# Patient Record
Sex: Male | Born: 1980 | Race: White | Hispanic: No | Marital: Married | Smoking: Never smoker
Health system: Southern US, Community
[De-identification: ages and names within clinical notes are randomized; demographics above are authoritative.]

---

## 2006-05-03 ENCOUNTER — Other Ambulatory Visit: Payer: Self-pay

## 2006-05-03 ENCOUNTER — Emergency Department: Payer: Self-pay | Admitting: Emergency Medicine

## 2006-08-22 ENCOUNTER — Ambulatory Visit: Payer: Self-pay | Admitting: Psychiatry

## 2007-03-14 ENCOUNTER — Ambulatory Visit: Payer: Self-pay | Admitting: Gastroenterology

## 2010-11-08 ENCOUNTER — Emergency Department: Payer: Self-pay | Admitting: Emergency Medicine

## 2011-12-14 ENCOUNTER — Emergency Department: Payer: Self-pay

## 2012-02-17 ENCOUNTER — Ambulatory Visit: Payer: Self-pay | Admitting: Gastroenterology

## 2015-11-09 ENCOUNTER — Ambulatory Visit: Payer: Self-pay | Admitting: Physician Assistant

## 2015-11-09 ENCOUNTER — Encounter: Payer: Self-pay | Admitting: Physician Assistant

## 2015-11-09 VITALS — BP 121/90 | HR 116 | Temp 98.6°F

## 2015-11-09 DIAGNOSIS — M1000 Idiopathic gout, unspecified site: Secondary | ICD-10-CM

## 2015-11-09 MED ORDER — INDOMETHACIN 25 MG PO CAPS: 25.0000 mg | ORAL_CAPSULE | Freq: Three times a day (TID) | ORAL | Status: DC

## 2015-11-09 MED ORDER — COLCHICINE 0.6 MG PO TABS: ORAL_TABLET | ORAL | Status: DC

## 2015-11-09 NOTE — Progress Notes (Signed)
S: c/o gout flare, hx of same but never been diagnosed, took one of his friends colchicine without relief, also knees still hurting and would like to see another doctor, the doctor [facility] told him he had a bone spur that had broken off and he would just need to take nsaids when he hurts, pt states he hurts every day   O: vitals wnl, nad, left foot with red swollen area at great toe, tender a joint, full rom , n/v intact, knees with full rom, n/v intact  A: gout, chronic knee pain  P: indocin, colchicine, low purine diet, refer to sports med, labs cbc, uric acid

## 2015-11-09 NOTE — Patient Instructions (Signed)
Gout  Gout is when your joints become red, sore, and swell (inflamed). This is caused by the buildup of uric acid crystals in the joints. Uric acid is a chemical that is normally in the blood. If the level of uric acid gets too high in the blood, these crystals form in your joints and tissues. Over time, these crystals can form into masses near the joints and tissues. These masses can destroy bone and cause the bone to look misshapen (deformed).  HOME CARE    Do not take aspirin for pain.   Only take medicine as told by your doctor.   Rest the joint as much as you can. When in bed, keep sheets and blankets off painful areas.   Keep the sore joints raised (elevated).   Put warm or cold packs on painful joints. Use of warm or cold packs depends on which works best for you.   Use crutches if the painful joint is in your leg.   Drink enough fluids to keep your pee (urine) clear or pale yellow. Limit alcohol, sugary drinks, and drinks with fructose in them.   Follow your diet instructions. Pay careful attention to how much protein you eat. Include fruits, vegetables, whole grains, and fat-free or low-fat milk products in your daily diet. Talk to your doctor or dietitian about the use of coffee, vitamin C, and cherries. These may help lower uric acid levels.   Keep a healthy body weight.  GET HELP RIGHT AWAY IF:    You have watery poop (diarrhea), throw up (vomit), or have any side effects from medicines.   You do not feel better in 24 hours, or you are getting worse.   Your joint becomes suddenly more tender, and you have chills or a fever.  MAKE SURE YOU:    Understand these instructions.   Will watch your condition.   Will get help right away if you are not doing well or get worse.     This information is not intended to replace advice given to you by your health care provider. Make sure you discuss any questions you have with your health care provider.     Document Released: 09/20/2008 Document Revised:  01/02/2015 Document Reviewed: 07/25/2012  Elsevier Interactive Patient Education 2016 Elsevier Inc.  Low-Purine Diet  Purines are compounds that affect the level of uric acid in your body. A low-purine diet is a diet that is low in purines. Eating a low-purine diet can prevent the level of uric acid in your body from getting too high and causing gout or kidney stones or both.  WHAT DO I NEED TO KNOW ABOUT THIS DIET?   Choose low-purine foods. Examples of low-purine foods are listed in the next section.   Drink plenty of fluids, especially water. Fluids can help remove uric acid from your body. Try to drink 8-16 cups (1.9-3.8 L) a day.   Limit foods high in fat, especially saturated fat, as fat makes it harder for the body to get rid of uric acid. Foods high in saturated fat include pizza, cheese, ice cream, whole milk, fried foods, and gravies. Choose foods that are lower in fat and lean sources of protein. Use olive oil when cooking as it contains healthy fats that are not high in saturated fat.   Limit alcohol. Alcohol interferes with the elimination of uric acid from your body. If you are having a gout attack, avoid all alcohol.   Keep in mind that different people's bodies   react differently to different foods. You will probably learn over time which foods do or do not affect you. If you discover that a food tends to cause your gout to flare up, avoid eating that food. You can more freely enjoy foods that do not cause problems. If you have any questions about a food item, talk to your dietitian or health care provider.  WHICH FOODS ARE LOW, MODERATE, AND HIGH IN PURINES?  The following is a list of foods that are low, moderate, and high in purines. You can eat any amount of the foods that are low in purines. You may be able to have small amounts of foods that are moderate in purines. Ask your health care provider how much of a food moderate in purines you can have. Avoid foods high in  purines.  Grains   Foods low in purines: Enriched white bread, pasta, rice, cake, cornbread, popcorn.   Foods moderate in purines: Whole-grain breads and cereals, wheat germ, bran, oatmeal. Uncooked oatmeal. Dry wheat bran or wheat germ.   Foods high in purines: Pancakes, French toast, biscuits, muffins.  Vegetables   Foods low in purines: All vegetables, except those that are moderate in purines.   Foods moderate in purines: Asparagus, cauliflower, spinach, mushrooms, green peas.  Fruits   All fruits are low in purines.  Meats and other Protein Foods   Foods low in purines: Eggs, nuts, peanut butter.   Foods moderate in purines: 80-90% lean beef, lamb, veal, pork, poultry, fish, eggs, peanut butter, nuts. Crab, lobster, oysters, and shrimp. Cooked dried beans, peas, and lentils.   Foods high in purines: Anchovies, sardines, herring, mussels, tuna, codfish, scallops, trout, and haddock. Bacon. Organ meats (such as liver or kidney). Tripe. Game meat. Goose. Sweetbreads.  Dairy   All dairy foods are low in purines. Low-fat and fat-free dairy products are best because they are low in saturated fat.  Beverages   Drinks low in purines: Water, carbonated beverages, tea, coffee, cocoa.   Drinks moderate in purines: Soft drinks and other drinks sweetened with high-fructose corn syrup. Juices. To find whether a food or drink is sweetened with high-fructose corn syrup, look at the ingredients list.   Drinks high in purines: Alcoholic beverages (such as beer).  Condiments   Foods low in purines: Salt, herbs, olives, pickles, relishes, vinegar.   Foods moderate in purines: Butter, margarine, oils, mayonnaise.  Fats and Oils   Foods low in purines: All types, except gravies and sauces made with meat.   Foods high in purines: Gravies and sauces made with meat.  Other Foods   Foods low in purines: Sugars, sweets, gelatin. Cake. Soups made without meat.   Foods moderate in purines: Meat-based or fish-based soups,  broths, or bouillons. Foods and drinks sweetened with high-fructose corn syrup.   Foods high in purines: High-fat desserts (such as ice cream, cookies, cakes, pies, doughnuts, and chocolate).  Contact your dietitian for more information on foods that are not listed here.     This information is not intended to replace advice given to you by your health care provider. Make sure you discuss any questions you have with your health care provider.     Document Released: 04/08/2011 Document Revised: 12/17/2013 Document Reviewed: 11/18/2013  Elsevier Interactive Patient Education 2016 Elsevier Inc.

## 2015-11-10 LAB — CBC WITH DIFFERENTIAL/PLATELET
BASOS: 1 %
Basophils Absolute: 0.1 10*3/uL (ref 0.0–0.2)
EOS (ABSOLUTE): 0.1 10*3/uL (ref 0.0–0.4)
EOS: 2 %
HEMATOCRIT: 47.9 % (ref 37.5–51.0)
Hemoglobin: 16.8 g/dL (ref 12.6–17.7)
IMMATURE GRANS (ABS): 0.1 10*3/uL (ref 0.0–0.1)
IMMATURE GRANULOCYTES: 1 %
LYMPHS: 35 %
Lymphocytes Absolute: 2.6 10*3/uL (ref 0.7–3.1)
MCH: 31.5 pg (ref 26.6–33.0)
MCHC: 35.1 g/dL (ref 31.5–35.7)
MCV: 90 fL (ref 79–97)
MONOS ABS: 0.7 10*3/uL (ref 0.1–0.9)
Monocytes: 10 %
NEUTROS PCT: 51 %
Neutrophils Absolute: 3.9 10*3/uL (ref 1.4–7.0)
PLATELETS: 354 10*3/uL (ref 150–379)
RBC: 5.33 x10E6/uL (ref 4.14–5.80)
RDW: 13.8 % (ref 12.3–15.4)
WBC: 7.5 10*3/uL (ref 3.4–10.8)

## 2015-11-10 LAB — URIC ACID: Uric Acid: 8.2 mg/dL (ref 3.7–8.6)

## 2015-11-26 ENCOUNTER — Ambulatory Visit (INDEPENDENT_AMBULATORY_CARE_PROVIDER_SITE_OTHER): Payer: PRIVATE HEALTH INSURANCE | Admitting: Family Medicine

## 2015-11-26 ENCOUNTER — Other Ambulatory Visit (INDEPENDENT_AMBULATORY_CARE_PROVIDER_SITE_OTHER): Payer: PRIVATE HEALTH INSURANCE

## 2015-11-26 ENCOUNTER — Encounter: Payer: Self-pay | Admitting: Family Medicine

## 2015-11-26 VITALS — BP 140/92 | HR 97 | Wt 257.0 lb

## 2015-11-26 DIAGNOSIS — M109 Gout, unspecified: Secondary | ICD-10-CM | POA: Insufficient documentation

## 2015-11-26 DIAGNOSIS — M25561 Pain in right knee: Secondary | ICD-10-CM

## 2015-11-26 DIAGNOSIS — E663 Overweight: Secondary | ICD-10-CM | POA: Diagnosis not present

## 2015-11-26 DIAGNOSIS — M25562 Pain in left knee: Secondary | ICD-10-CM | POA: Diagnosis not present

## 2015-11-26 DIAGNOSIS — M10069 Idiopathic gout, unspecified knee: Secondary | ICD-10-CM

## 2015-11-26 MED ORDER — ALLOPURINOL 100 MG PO TABS: 100.0000 mg | ORAL_TABLET | Freq: Every day | ORAL | Status: DC

## 2015-11-26 MED ORDER — DICLOFENAC SODIUM 2 % TD SOLN: 2.0000 "application " | Freq: Two times a day (BID) | TRANSDERMAL | Status: DC

## 2015-11-26 NOTE — Progress Notes (Signed)
Tawana ScaleZach Mcguire D.O. [facility] [address] [place], [place] [address] Phone: [phone] Subjective:    I'm seeing this patient by the request  of:  No primary care provider on file. Greig RightSusan Fisher  CC: Bilateral knee pain  OZH:[id number]:Subjective Clent RidgesJesse E Mcguire is a 34 y.o. male coming in with complaint of bilateral knee pain. Patient is had this pain intermittently for multiple years. Patient does not remember any specific injury. Does do a lot of manual labor. States that certain activities such as going up and downstairs or a ladder can cause increased pain. Has seen another provider for previously and had an x-ray. He was told that he had a bone spur. States that he was just told to wear brace and that he should get better. Patient also has a past medical history significant for gout. Patient rates the severity of pain a 6 out of 10. Does respond to anti-inflammatories. Does not have any instability. Denies any nighttime awakening.    No past medical history on file.  Gout, seasonal allergies.  No past surgical history on file. none stated.  Social History  Substance Use Topics  . Smoking status: Unknown If Ever Smoked  . Smokeless tobacco: None  . Alcohol Use: None   No Known Allergies No family history on file.   Past medical history, social, surgical and family history all reviewed in electronic medical record.   Review of Systems: No headache, visual changes, nausea, vomiting, diarrhea, constipation, dizziness, abdominal pain, skin rash, fevers, chills, night sweats, weight loss, swollen lymph nodes, body aches, joint swelling, muscle aches, chest pain, shortness of breath, mood changes.   Objective Blood pressure 140/92, pulse 97, weight 257 lb (116.574 kg), SpO2 97 %.  General: No apparent distress alert and oriented x3 mood and affect normal, dressed appropriately.  HEENT: Pupils equal, extraocular movements intact  Respiratory: Patient's speak in full sentences and does  not appear short of breath  Cardiovascular: No lower extremity edema, non tender, no erythema  Skin: Warm dry intact with no signs of infection or rash on extremities or on axial skeleton.  Abdomen: Soft nontender  Neuro: Cranial nerves II through XII are intact, neurovascularly intact in all extremities with 2+ DTRs and 2+ pulses.  Lymph: No lymphadenopathy of posterior or anterior cervical chain or axillae bilaterally.  Gait normal with good balance and coordination.  MSK:  Non tender with full range of motion and good stability and symmetric strength and tone of shoulders, elbows, wrist, hip, and ankles bilaterally.  Knee: Bilateral Normal to inspection with no erythema or effusion or obvious bony abnormalities. Palpation normal with no warmth, joint line tenderness, patellar tenderness, or condyle tenderness. ROM full in flexion and extension and lower leg rotation. Ligaments with solid consistent endpoints including ACL, PCL, LCL, MCL. Negative Mcmurray's, Apley's, and Thessalonian tests. Non painful patellar compression. Patellar glide without crepitus. Patellar and quadriceps tendons unremarkable. Hamstring and quadriceps strength is normal.  Fairly unremarkable exam  MSK US performed of: Bilateral  This study was ordered, performed, and interpreted by Terrilee FilesZach Mcguire D.O.  Knee: All structures visualized. Minimal narrowing of the medial joint space narrowing patient's right knee does have double line that can be consistent with gouty deposits. Patellar Tendon unremarkable on long and transverse views without effusion. No abnormality of prepatellar bursa. LCL and MCL unremarkable on long and transverse views. No abnormality of origin of medial or lateral head of the gastrocnemius. 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

## 2015-11-26 NOTE — Assessment & Plan Note (Signed)
Encourage weight loss. 

## 2015-11-26 NOTE — Patient Instructions (Addendum)
Good to see you.  Ice 20 minutes 2 times daily. Usually after activity and before bed. Wear brace or get a compression sleeve (tommy copper) pennsaid pinkie amount topically 2 times daily as needed.  allopurinol daily to treat gout but continue the indomethacin at the same time.  Vitamin D 2000 IU daily Turmeric 500mg  twice daily See me again in 3-6 weeks.

## 2015-11-26 NOTE — Progress Notes (Signed)
Pre visit review using our clinic review tool, if applicable. No additional management support is needed unless otherwise documented below in the visit note. 

## 2015-11-26 NOTE — Assessment & Plan Note (Signed)
I do believe the patient's knee pain is multifactorial. 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 I do not see any type of internal derangement on exam or on ultrasound today. Do not feel further workup such as x-rays as necessary at this time. Did not notice any instabilities and no bracing on a regular basis is necessary. I would like to start him on a low dose of allopurinol and warned of potential side effects. We discussed icing regimen, topical anti-inflammatories and prescription was sent in. Patient will remain active. Patient come back again in 3-6 weeks to make sure that he is responding to management.

## 2016-01-06 ENCOUNTER — Ambulatory Visit: Payer: Self-pay | Admitting: Physician Assistant

## 2016-01-06 ENCOUNTER — Encounter: Payer: Self-pay | Admitting: Physician Assistant

## 2016-01-06 VITALS — BP 140/100 | HR 91 | Temp 98.4°F

## 2016-01-06 DIAGNOSIS — J069 Acute upper respiratory infection, unspecified: Secondary | ICD-10-CM

## 2016-01-06 MED ORDER — AMOXICILLIN-POT CLAVULANATE 875-125 MG PO TABS: 1.0000 | ORAL_TABLET | Freq: Two times a day (BID) | ORAL | Status: DC

## 2016-01-06 NOTE — Progress Notes (Signed)
S: C/o runny nose and congestion for 3 days, no fever, chills, cp/sob, v/d; mucus is green and thick, cough is sporadic, cough is controlled with dayquil and nyquil,   O: PE: vitals wnl except bp elevated, nad,  perrl eomi, normocephalic, tms dull, nasal mucosa red and swollen, throat injected, neck supple no lymph, lungs c t a, cv rrr, neuro intact  A:  Acute sinusitis, elevated bp secondary to cold meds   P: augmentin 875mg  bid, stop cold meds with decongestant; drink fluids, continue regular meds , return if not improving in 5 days, return earlier if worsening

## 2016-01-07 ENCOUNTER — Encounter: Payer: Self-pay | Admitting: Family Medicine

## 2016-01-07 ENCOUNTER — Ambulatory Visit (INDEPENDENT_AMBULATORY_CARE_PROVIDER_SITE_OTHER): Payer: Managed Care, Other (non HMO) | Admitting: Family Medicine

## 2016-01-07 ENCOUNTER — Ambulatory Visit (INDEPENDENT_AMBULATORY_CARE_PROVIDER_SITE_OTHER)
Admission: RE | Admit: 2016-01-07 | Discharge: 2016-01-07 | Disposition: A | Discharge status: Home / Self Care | Payer: Managed Care, Other (non HMO) | Source: Ambulatory Visit | Attending: Family Medicine | Admitting: Family Medicine

## 2016-01-07 VITALS — BP 132/88 | HR 86 | Wt 258.0 lb

## 2016-01-07 DIAGNOSIS — M10069 Idiopathic gout, unspecified knee: Secondary | ICD-10-CM

## 2016-01-07 DIAGNOSIS — M25562 Pain in left knee: Secondary | ICD-10-CM

## 2016-01-07 DIAGNOSIS — M25561 Pain in right knee: Secondary | ICD-10-CM | POA: Diagnosis not present

## 2016-01-07 MED ORDER — ALLOPURINOL 100 MG PO TABS
200.0000 mg | ORAL_TABLET | Freq: Every day | ORAL | Status: DC
Start: 2016-01-07 — End: 2016-02-04

## 2016-01-07 NOTE — Progress Notes (Signed)
[facility] [address] [place], [place] [address] Phone: [phone] Subjective:     CC: Bilateral knee pain f/u   [id number] Vincent Mcguire is a 35 y.o. male coming in with complaint of bilateral knee pain. Patient appeared to likely have some gouty deposits in the knees. Patient was started on colchicine as well as allopurinol. Patient given topical anti-inflammatories for pain relief. Patient was to monitor for any type of worsening symptoms. Patient states very minimal improvement and even some days when it seems worsening in pain. States that he continues to work. In any type of pressure on the knees or any flexion of the knees continue to give him some discomfort. Patient states that it is not stopping him from activities but has made to have to augment how he positions himself's. Patient continues to be very active. No side effects to the allopurinol. States that when he took colchicine very minimal improvement. States that the topical anti-inflammatory though has been the most helpful if anything.    No past medical history on file.  Gout, seasonal allergies.  No past surgical history on file. none stated.  Social History  Substance Use Topics  . Smoking status: Never Smoker   . Smokeless tobacco: Current User  . Alcohol Use: None   No Known Allergies No family history on file. positive family history of gout as well as a positive family history of autoimmune disease. Patient does not know which one.   Past medical history, social, surgical and family history all reviewed in electronic medical record.   Review of Systems: No headache, visual changes, nausea, vomiting, diarrhea, constipation, dizziness, abdominal pain, skin rash, fevers, chills, night sweats, weight loss, swollen lymph nodes, body aches, joint swelling, muscle aches, chest pain, shortness of breath, mood changes.   Objective Blood pressure 132/88, pulse 86, weight 258 lb (117.028  kg), SpO2 96 %.  General: No apparent distress alert and oriented x3 mood and affect normal, dressed appropriately.  HEENT: Pupils equal, extraocular movements intact  Respiratory: Patient's speak in full sentences and does not appear short of breath  Cardiovascular: No lower extremity edema, non tender, no erythema  Skin: Warm dry intact with no signs of infection or rash on extremities or on axial skeleton.  Abdomen: Soft nontender  Neuro: Cranial nerves II through XII are intact, neurovascularly intact in all extremities with 2+ DTRs and 2+ pulses.  Lymph: No lymphadenopathy of posterior or anterior cervical chain or axillae bilaterally.  Gait normal with good balance and coordination.  MSK:  Non tender with full range of motion and good stability and symmetric strength and tone of shoulders, elbows, wrist, hip, and ankles bilaterally.  Knee: Bilateral Normal to inspection with no erythema or effusion or obvious bony abnormalities. Palpation normal with no warmth, joint line tenderness, patellar tenderness, or condyle tenderness. ROM full in flexion and extension and lower leg rotation. Ligaments with solid consistent endpoints including ACL, PCL, LCL, MCL. Negative Mcmurray's, Apley's, and Thessalonian tests. Non painful patellar compression. Patellar glide with minimal crepitus. Patellar and quadriceps tendons unremarkable. Hamstring and quadriceps strength is normal.  Fairly unremarkable exam  After informed written and verbal consent, patient was seated on exam table. Right knee was prepped with alcohol swab and utilizing anterolateral approach, patient's right knee space was injected with 4:1  marcaine 0.5%: Kenalog 40mg /dL. Patient tolerated the procedure well without immediate complications.  After informed written and verbal consent, patient was seated on exam table.  Left knee was prepped with alcohol swab and utilizing anterolateral approach, patient's left knee space was  injected with 4:1  marcaine 0.5%: Kenalog 40mg /dL. Patient tolerated the procedure well without immediate complications.    Impression and Recommendations:     This case required medical decision making of moderate complexity.

## 2016-01-07 NOTE — Progress Notes (Signed)
Pre visit review using our clinic review tool, if applicable. No additional management support is needed unless otherwise documented below in the visit note. 

## 2016-01-07 NOTE — Assessment & Plan Note (Signed)
Increased to 200 mg daily

## 2016-01-07 NOTE — Assessment & Plan Note (Signed)
I do believe the patient's bilateral knee pain is more secondary to the gout. X-rays ordered today for further evaluation for any other bony normality. No signs or symptoms that would be corresponding with a meniscal injury. Patient does have some increasing wear and tear secondary to the work that he does. Patient remains active and has noticed some improvement with the brace. We discussed the possibility of formal physical therapy which patient declined. Patient is to try the injections as well as increasing the allopurinol Avenue perception given. Patient will come back in 3 weeks to make sure he is responding.

## 2016-01-07 NOTE — Patient Instructions (Signed)
Good to see you Ice is your friend Xray downstairs on your way out and no news is good news Stay active  Wear brace when needed Lets increase the allopurinol to 200mg  daily to see if it helps.  Stop if you get a rash See me again in 4 weeks!

## 2016-02-04 ENCOUNTER — Ambulatory Visit (INDEPENDENT_AMBULATORY_CARE_PROVIDER_SITE_OTHER): Payer: Managed Care, Other (non HMO) | Admitting: Family Medicine

## 2016-02-04 ENCOUNTER — Encounter: Payer: Self-pay | Admitting: Family Medicine

## 2016-02-04 VITALS — BP 140/108 | HR 90 | Wt 255.0 lb

## 2016-02-04 DIAGNOSIS — M25561 Pain in right knee: Secondary | ICD-10-CM | POA: Diagnosis not present

## 2016-02-04 DIAGNOSIS — E663 Overweight: Secondary | ICD-10-CM

## 2016-02-04 DIAGNOSIS — M25562 Pain in left knee: Secondary | ICD-10-CM | POA: Diagnosis not present

## 2016-02-04 DIAGNOSIS — M10069 Idiopathic gout, unspecified knee: Secondary | ICD-10-CM

## 2016-02-04 MED ORDER — ALLOPURINOL 300 MG PO TABS: 300.0000 mg | ORAL_TABLET | Freq: Every day | ORAL | Status: DC

## 2016-02-04 NOTE — Assessment & Plan Note (Signed)
Discussed with patient at great length. I do not see any bony normality. Patient to continue the topical anti-inflammatories. I do think that this is likely secondary to gout and patient being overweight. Patient will have his allopurinol increased to 300 mg. We also discussed continuing over-the-counter medicines and given some other choices. Patient and will come back and see me again in 6 weeks. If patient has any instability of the knee advance imaging would be warranted.

## 2016-02-04 NOTE — Assessment & Plan Note (Signed)
Encourage weight loss. Discussed the possibility of formal physical therapy which patient declined.

## 2016-02-04 NOTE — Patient Instructions (Signed)
Good to see you  Allopurinol  at night Tart cherry extract pill any dose at night Try a bike or walk on a treadmill We can always consider physical therapy  If worsening symptoms we may need to consider MRI but would likely mean surgery  See me again in 6-8 weeks.

## 2016-02-04 NOTE — Progress Notes (Signed)
Pre visit review using our clinic review tool, if applicable. No additional management support is needed unless otherwise documented below in the visit note. 

## 2016-02-04 NOTE — Assessment & Plan Note (Signed)
Patient increased to 300 mg allopurinol

## 2016-02-04 NOTE — Progress Notes (Signed)
  [facility] [address] [place], [place] [address] Phone: [phone] Subjective:     CC: Bilateral knee pain f/u   Vincent Mcguire is a 35 y.o. male coming in with complaint of bilateral knee pain. Patient appeared to likely have some gouty deposits in the knees. Patient elected try injections after last visit. An states that they were somewhat helpful. Feels that the allopurinol going up to 200 mg was even more helpful. Comfort on at all times. Patient states that when he puts pressure on the knees is still seems to be very uncomfortable. Been wearing the brace fairly regularly. Denies any new symptoms such as radiation or swelling.    No past medical history on file.  Gout, seasonal allergies.  No past surgical history on file. none stated.  Social History  Substance Use Topics  . Smoking status: Never Smoker   . Smokeless tobacco: Current User  . Alcohol Use: Not on file   No Known Allergies No family history on file. positive family history of gout as well as a positive family history of autoimmune disease. Patient does not know which one.   Past medical history, social, surgical and family history all reviewed in electronic medical record.   Review of Systems: No headache, visual changes, nausea, vomiting, diarrhea, constipation, dizziness, abdominal pain, skin rash, fevers, chills, night sweats, weight loss, swollen lymph nodes, body aches, joint swelling, muscle aches, chest pain, shortness of breath, mood changes.   Objective Blood pressure 140/108, pulse 90, weight 255 lb (115.667 kg), SpO2 95 %.  General: No apparent distress alert and oriented x3 mood and affect normal, dressed appropriately.  HEENT: Pupils equal, extraocular movements intact  Respiratory: Patient's speak in full sentences and does not appear short of breath  Cardiovascular: No lower extremity edema, non tender, no erythema  Skin: Warm dry intact with no signs  of infection or rash on extremities or on axial skeleton.  Abdomen: Soft nontender  Neuro: Cranial nerves II through XII are intact, neurovascularly intact in all extremities with 2+ DTRs and 2+ pulses.  Lymph: No lymphadenopathy of posterior or anterior cervical chain or axillae bilaterally.  Gait normal with good balance and coordination.  MSK:  Non tender with full range of motion and good stability and symmetric strength and tone of shoulders, elbows, wrist, hip, and ankles bilaterally.  Knee: Bilateral Normal to inspection with no erythema or effusion or obvious bony abnormalities. Minimal tenderness over the medial joint lines ROM full in flexion and extension and lower leg rotation. Ligaments with solid consistent endpoints including ACL, PCL, LCL, MCL. Negative Mcmurray's, Apley's, and Thessalonian tests. Non painful patellar compression. Patellar glide with minimal crepitus. Patellar and quadriceps tendons unremarkable. Hamstring and quadriceps strength is normal.  unremarkable exam     Impression and Recommendations:     This case required medical decision making of moderate complexity.

## 2016-03-21 ENCOUNTER — Ambulatory Visit: Payer: Self-pay | Admitting: Physician Assistant

## 2016-03-21 ENCOUNTER — Encounter: Payer: Self-pay | Admitting: Physician Assistant

## 2016-03-21 VITALS — BP 110/70 | HR 118 | Temp 99.5°F

## 2016-03-21 DIAGNOSIS — J09X2 Influenza due to identified novel influenza A virus with other respiratory manifestations: Secondary | ICD-10-CM

## 2016-03-21 DIAGNOSIS — R509 Fever, unspecified: Secondary | ICD-10-CM

## 2016-03-21 LAB — POCT INFLUENZA A/B
INFLUENZA B, POC: NEGATIVE
Influenza A, POC: POSITIVE — AB

## 2016-03-21 MED ORDER — HYDROCOD POLST-CPM POLST ER 10-8 MG/5ML PO SUER
5.0000 mL | Freq: Two times a day (BID) | ORAL | Status: DC | PRN
Start: 2016-03-21 — End: 2016-05-12

## 2016-03-21 MED ORDER — OSELTAMIVIR PHOSPHATE 75 MG PO CAPS: 75.0000 mg | ORAL_CAPSULE | Freq: Two times a day (BID) | ORAL | Status: DC

## 2016-03-21 NOTE — Progress Notes (Signed)
S: C/o runny nose and congestion with dry cough for 2 days, + fever, chills, and body aches;  denies cp/sob, v/d;    Using otc meds: nyquil  O: PE: vitals w elevated temp,  nad,  perrl eomi, normocephalic, tms dull, nasal mucosa red and swollen, throat injected, neck supple no lymph, lungs c t a, cv rrr, neuro intact, flu swab + A  A:  Acute influenza A   P: tamiflu, tussionex; drink fluids, continue regular meds , use otc meds of choice, return if not improving in 5 days, return earlier if worsening

## 2016-03-31 ENCOUNTER — Ambulatory Visit (INDEPENDENT_AMBULATORY_CARE_PROVIDER_SITE_OTHER): Payer: Managed Care, Other (non HMO) | Admitting: Family Medicine

## 2016-03-31 ENCOUNTER — Encounter: Payer: Self-pay | Admitting: Family Medicine

## 2016-03-31 ENCOUNTER — Ambulatory Visit (INDEPENDENT_AMBULATORY_CARE_PROVIDER_SITE_OTHER)
Admission: RE | Admit: 2016-03-31 | Discharge: 2016-03-31 | Disposition: A | Discharge status: Home / Self Care | Payer: Managed Care, Other (non HMO) | Source: Ambulatory Visit | Attending: Family Medicine | Admitting: Family Medicine

## 2016-03-31 VITALS — BP 142/84 | HR 64 | Wt 258.0 lb

## 2016-03-31 DIAGNOSIS — M10069 Idiopathic gout, unspecified knee: Secondary | ICD-10-CM | POA: Diagnosis not present

## 2016-03-31 DIAGNOSIS — M25561 Pain in right knee: Secondary | ICD-10-CM

## 2016-03-31 DIAGNOSIS — M25562 Pain in left knee: Secondary | ICD-10-CM

## 2016-03-31 DIAGNOSIS — E663 Overweight: Secondary | ICD-10-CM | POA: Diagnosis not present

## 2016-03-31 MED ORDER — PREDNISONE 50 MG PO TABS: 50.0000 mg | ORAL_TABLET | Freq: Every day | ORAL | Status: DC

## 2016-03-31 NOTE — Assessment & Plan Note (Signed)
Dentist point still no significant findings other than the elevated uric acid that could be contribute into some of his pain. Patient does not feel that the allopurinol is helping and he can do a trial without it. We discussed continuing the topical anti-inflammatories. Patient given a 5 day dose of prednisone to see if this will be beneficial. We may need to consider further workup including autoimmune diseases to rule out. At this point though I do think most of it is musculoskeletal and patient will be referred to formal physical therapy. History of a back fracture and we'll get x-rays to further evaluate to make sure there is no radicular symptoms that are contribute in. Patient and will come back and see me again in 4-6 weeks for further evaluation and treatment.

## 2016-03-31 NOTE — Assessment & Plan Note (Signed)
Discussed with patient about a trial off the medication. If worsening symptoms to restart.

## 2016-03-31 NOTE — Assessment & Plan Note (Signed)
Encourage weight loss. 

## 2016-03-31 NOTE — Patient Instructions (Signed)
Good to see you  Stop the allopurinol  Would continue the vitamins Xray downstairs today of your back  Physical therapy will be calling you  Ice is still good.  Prednisone daily for 5 days.  See me again in 6 weeks to make sure you have made improvement or we will need to consider MRI of the knees or back if your xray is bad.

## 2016-03-31 NOTE — Progress Notes (Signed)
  Tawana ScaleZach Smith D.O. [facility] [address] [place], [place] [address] Phone: [phone] Subjective:     CC: Bilateral knee pain f/u   BJY:NWGNFAOZHYHPI:Subjective Clent RidgesJesse E Mcguire is a 35 y.o. male coming in with complaint of bilateral knee pain. Patient appeared to likely have some gouty deposits in the knees. Patient elected try injections LMA mild improvement. We didn't increase his allopurinol 300 mg. Continues to have difficulty as well. Does not think that the medication is improving. Patient states sometimes it feels like he has weakness in the knees bilaterally when he is coming down stairs at the end of a long day. Still more pain when he puts pressure on the knees themselves.has had workup including x-rays previously that showed no bony abnormality that were independently visualized by me.patient does give a new history of a back fracture greater than 6 she should go from a fall of 12 feet. States sometimes he has some mild back pain with it.    No past medical history on file.  Gout, seasonal allergies.  No past surgical history on file. none stated.  Social History  Substance Use Topics  . Smoking status: Never Smoker   . Smokeless tobacco: Current User  . Alcohol Use: None   No Known Allergies No family history on file. positive family history of gout as well as a positive family history of autoimmune disease. Patient does not know which one.   Past medical history, social, surgical and family history all reviewed in electronic medical record.   Review of Systems: No headache, visual changes, nausea, vomiting, diarrhea, constipation, dizziness, abdominal pain, skin rash, fevers, chills, night sweats, weight loss, swollen lymph nodes, body aches, joint swelling, muscle aches, chest pain, shortness of breath, mood changes.   Objective Blood pressure 142/84, pulse 64, weight 258 lb (117.028 kg).  General: No apparent distress alert and oriented x3 mood and affect normal, dressed  appropriately.  HEENT: Pupils equal, extraocular movements intact  Respiratory: Patient's speak in full sentences and does not appear short of breath  Cardiovascular: No lower extremity edema, non tender, no erythema  Skin: Warm dry intact with no signs of infection or rash on extremities or on axial skeleton.  Abdomen: Soft nontender  Neuro: Cranial nerves II through XII are intact, neurovascularly intact in all extremities with 2+ DTRs and 2+ pulses.  Lymph: No lymphadenopathy of posterior or anterior cervical chain or axillae bilaterally.  Gait normal with good balance and coordination.  MSK:  Non tender with full range of motion and good stability and symmetric strength and tone of shoulders, elbows, wrist, hip, and ankles bilaterally.  Knee: Bilateral Normal to inspection with no erythema or effusion or obvious bony abnormalities. Continued mild tenderness to palpation mostly over the medial joint lines. ROM full in flexion and extension and lower leg rotation. Ligaments with solid consistent endpoints including ACL, PCL, LCL, MCL. Negative Mcmurray's, Apley's, and Thessalonian tests. Non painful patellar compression. Patellar glide with minimal crepitus would not consider any significant change.. Patellar and quadriceps tendons unremarkable. Hamstring and quadriceps strength is normal.  unremarkable exam     Impression and Recommendations:     This case required medical decision making of moderate complexity.

## 2016-05-12 ENCOUNTER — Ambulatory Visit (INDEPENDENT_AMBULATORY_CARE_PROVIDER_SITE_OTHER): Payer: Managed Care, Other (non HMO) | Admitting: Family Medicine

## 2016-05-12 ENCOUNTER — Encounter: Payer: Self-pay | Admitting: Family Medicine

## 2016-05-12 VITALS — BP 130/84 | HR 86 | Wt 251.0 lb

## 2016-05-12 DIAGNOSIS — M10069 Idiopathic gout, unspecified knee: Secondary | ICD-10-CM | POA: Diagnosis not present

## 2016-05-12 DIAGNOSIS — M25562 Pain in left knee: Secondary | ICD-10-CM

## 2016-05-12 DIAGNOSIS — E663 Overweight: Secondary | ICD-10-CM

## 2016-05-12 DIAGNOSIS — M25561 Pain in right knee: Secondary | ICD-10-CM | POA: Diagnosis not present

## 2016-05-12 MED ORDER — FEBUXOSTAT 40 MG PO TABS: 40.0000 mg | ORAL_TABLET | Freq: Every day | ORAL | Status: DC

## 2016-05-12 NOTE — Assessment & Plan Note (Signed)
Secondary to gout. Discussed diet changes again, we discussed avoiding direct impact in the area. Hopefully will start to improve. Would like to avoid more injection secondary to patient's age. If necessary patient will come back again in 6-12 weeks

## 2016-05-12 NOTE — Patient Instructions (Signed)
Good to see you  Ice 20 minutes 2 times daily. Usually after activity and before bed. Try the uloric daily  Tart cherry extract at night any dose. Try [employer] Finish PT this week.  Send me a message in my chart in 6 weeks and tell me how you are doing.

## 2016-05-12 NOTE — Assessment & Plan Note (Signed)
Encourage weight loss. 

## 2016-05-12 NOTE — Assessment & Plan Note (Signed)
Do believe that this is patient's underlying condition. We did discuss uloric and given a prescription coupon today. I'm hoping that this will be beneficial. We discussed the possibility of prednisone again but patient declined that at the moment. We discussed icing regimen. We did discuss possibly using colchicine on a regular basis which patient stated that he felt was going to be too expensive. Encourage him to attempt weight loss. Continue the other exercises. Follow-up with me again in 6-12 weeks. If no significant improvement possible referral to rheumatology.

## 2016-05-12 NOTE — Progress Notes (Signed)
Pre visit review using our clinic review tool, if applicable. No additional management support is needed unless otherwise documented below in the visit note. 

## 2016-05-12 NOTE — Progress Notes (Signed)
  Tawana ScaleZach Kashawn Dirr D.O. [facility] [address] [place], [place] [address] Phone: [phone] Subjective:     CC: Bilateral knee pain f/u   OZH:YQMVHQIONGHPI:Subjective Vincent RidgesJesse E Mcguire is a 35 y.o. male coming in with complaint of bilateral knee pain. Patient appeared to likely have some gouty deposits in the knees. Patient denies any be responding to the allopurinol. Patient discontinue this. Seems that his knee pain as well as back pain seems to be getting somewhat worse. Patient went to formal physical therapy for both his knees and back and states that he has not notice improvement as well. States that he just has more soreness. Continues to try to stay active. Still same problems where patient was having previously.    No past medical history on file.  Gout, seasonal allergies.  No past surgical history on file. none stated.  Social History  Substance Use Topics  . Smoking status: Never Smoker   . Smokeless tobacco: Current User  . Alcohol Use: None   No Known Allergies No family history on file. positive family history of gout as well as a positive family history of autoimmune disease. Patient does not know which one.   Past medical history, social, surgical and family history all reviewed in electronic medical record.   Review of Systems: No headache, visual changes, nausea, vomiting, diarrhea, constipation, dizziness, abdominal pain, skin rash, fevers, chills, night sweats, weight loss, swollen lymph nodes, body aches, joint swelling, muscle aches, chest pain, shortness of breath, mood changes.   Objective Blood pressure 130/84, pulse 86, weight 251 lb (113.853 kg), SpO2 96 %.  General: No apparent distress alert and oriented x3 mood and affect normal, dressed appropriately.  HEENT: Pupils equal, extraocular movements intact  Respiratory: Patient's speak in full sentences and does not appear short of breath  Cardiovascular: No lower extremity edema, non tender, no erythema  Skin: Warm  dry intact with no signs of infection or rash on extremities or on axial skeleton.  Abdomen: Soft nontender  Neuro: Cranial nerves II through XII are intact, neurovascularly intact in all extremities with 2+ DTRs and 2+ pulses.  Lymph: No lymphadenopathy of posterior or anterior cervical chain or axillae bilaterally.  Gait normal with good balance and coordination.  MSK:  Non tender with full range of motion and good stability and symmetric strength and tone of shoulders, elbows, wrist, hip, and ankles bilaterally.  Knee: Bilateral Normal to inspection with no erythema or effusion or obvious bony abnormalities. Continued mild tenderness to palpation mostly over the medial joint lines. ROM full in flexion and extension and lower leg rotation. Ligaments with solid consistent endpoints including ACL, PCL, LCL, MCL. Negative Mcmurray's, Apley's, and Thessalonian tests. Non painful patellar compression. Patellar glide with minimal crepitus  Patellar and quadriceps tendons unremarkable. Hamstring and quadriceps strength is normal.  No significant change from previous exam    Impression and Recommendations:     This case required medical decision making of moderate complexity.

## 2016-07-19 ENCOUNTER — Ambulatory Visit: Payer: Self-pay | Admitting: Physician Assistant

## 2016-07-19 ENCOUNTER — Encounter: Payer: Self-pay | Admitting: Physician Assistant

## 2016-07-19 VITALS — BP 120/80 | HR 97 | Temp 98.4°F

## 2016-07-19 DIAGNOSIS — R0789 Other chest pain: Secondary | ICD-10-CM

## 2016-07-19 NOTE — Progress Notes (Signed)
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

## 2016-10-25 ENCOUNTER — Ambulatory Visit: Payer: Self-pay | Admitting: Physician Assistant

## 2016-10-25 ENCOUNTER — Encounter: Payer: Self-pay | Admitting: Physician Assistant

## 2016-10-25 VITALS — BP 120/79 | HR 92 | Temp 98.3°F

## 2016-10-25 DIAGNOSIS — R0789 Other chest pain: Secondary | ICD-10-CM

## 2016-10-25 NOTE — Progress Notes (Signed)
S: c/o cp and states his bp has been elevated for awhile, goes by EMS to have it checked and its been 160/100, not sure what size cuff they are using, states his chest will often hurt when he is in a crowd of people, states chest has been hurting for 2 days, no radiation of pain, no sob, no diaphoresis, fam hx + htn, dm, chf, grandfather died at 5686 of chf  O: vitals wnl, nad, lungs c t a, cv rrr, pt appears well  A: chest pain  P: ekg normal, will do fasting labs tomorrow, consider anxiety , refer to cardiology for eval if sx continue

## 2016-10-26 ENCOUNTER — Other Ambulatory Visit: Payer: Self-pay

## 2016-10-26 ENCOUNTER — Encounter (INDEPENDENT_AMBULATORY_CARE_PROVIDER_SITE_OTHER): Payer: Self-pay

## 2016-10-26 VITALS — BP 110/80

## 2016-10-26 DIAGNOSIS — Z299 Encounter for prophylactic measures, unspecified: Secondary | ICD-10-CM

## 2016-10-26 NOTE — Progress Notes (Signed)
Patient came in to have blood drawn for testing per Susan's authorization. 

## 2016-10-27 LAB — CMP12+LP+TP+TSH+6AC+CBC/D/PLT
A/G RATIO: 1.7 (ref 1.2–2.2)
ALBUMIN: 4.6 g/dL (ref 3.5–5.5)
ALT: 65 IU/L — ABNORMAL HIGH (ref 0–44)
AST: 32 IU/L (ref 0–40)
Alkaline Phosphatase: 72 IU/L (ref 39–117)
BASOS ABS: 0 10*3/uL (ref 0.0–0.2)
BILIRUBIN TOTAL: 0.5 mg/dL (ref 0.0–1.2)
BUN / CREAT RATIO: 16 (ref 9–20)
BUN: 22 mg/dL — ABNORMAL HIGH (ref 6–20)
Basos: 1 %
CHOLESTEROL TOTAL: 216 mg/dL — AB (ref 100–199)
Calcium: 10.2 mg/dL (ref 8.7–10.2)
Chloride: 99 mmol/L (ref 96–106)
Chol/HDL Ratio: 6 ratio units — ABNORMAL HIGH (ref 0.0–5.0)
Creatinine, Ser: 1.36 mg/dL — ABNORMAL HIGH (ref 0.76–1.27)
EOS (ABSOLUTE): 0.2 10*3/uL (ref 0.0–0.4)
EOS: 3 %
Estimated CHD Risk: 1.3 times avg. — ABNORMAL HIGH (ref 0.0–1.0)
FREE THYROXINE INDEX: 2 (ref 1.2–4.9)
GFR calc non Af Amer: 67 mL/min/{1.73_m2} (ref >59)
GFR, EST AFRICAN AMERICAN: 77 mL/min/{1.73_m2} (ref >59)
GGT: 113 IU/L — ABNORMAL HIGH (ref 0–65)
GLUCOSE: 114 mg/dL — AB (ref 65–99)
Globulin, Total: 2.7 g/dL (ref 1.5–4.5)
HDL: 36 mg/dL — AB (ref >39)
HEMOGLOBIN: 15.6 g/dL (ref 12.6–17.7)
Hematocrit: 45.3 % (ref 37.5–51.0)
IMMATURE GRANULOCYTES: 0 %
IRON: 104 ug/dL (ref 38–169)
Immature Grans (Abs): 0 10*3/uL (ref 0.0–0.1)
LDH: 176 IU/L (ref 121–224)
LDL Calculated: 134 mg/dL — ABNORMAL HIGH (ref 0–99)
LYMPHS ABS: 2 10*3/uL (ref 0.7–3.1)
Lymphs: 38 %
MCH: 30.8 pg (ref 26.6–33.0)
MCHC: 34.4 g/dL (ref 31.5–35.7)
MCV: 89 fL (ref 79–97)
MONOCYTES: 9 %
Monocytes Absolute: 0.5 10*3/uL (ref 0.1–0.9)
NEUTROS PCT: 49 %
Neutrophils Absolute: 2.7 10*3/uL (ref 1.4–7.0)
PLATELETS: 303 10*3/uL (ref 150–379)
Phosphorus: 3.4 mg/dL (ref 2.5–4.5)
Potassium: 5.3 mmol/L — ABNORMAL HIGH (ref 3.5–5.2)
RBC: 5.07 x10E6/uL (ref 4.14–5.80)
RDW: 13.3 % (ref 12.3–15.4)
Sodium: 142 mmol/L (ref 134–144)
T3 UPTAKE RATIO: 26 % (ref 24–39)
T4, Total: 7.5 ug/dL (ref 4.5–12.0)
TOTAL PROTEIN: 7.3 g/dL (ref 6.0–8.5)
TSH: 1.76 u[IU]/mL (ref 0.450–4.500)
Triglycerides: 232 mg/dL — ABNORMAL HIGH (ref 0–149)
Uric Acid: 9.1 mg/dL — ABNORMAL HIGH (ref 3.7–8.6)
VLDL CHOLESTEROL CAL: 46 mg/dL — AB (ref 5–40)
WBC: 5.4 10*3/uL (ref 3.4–10.8)

## 2016-10-27 LAB — VITAMIN D 25 HYDROXY (VIT D DEFICIENCY, FRACTURES): Vit D, 25-Hydroxy: 36.2 ng/mL (ref 30.0–100.0)

## 2017-03-22 ENCOUNTER — Other Ambulatory Visit: Payer: Self-pay

## 2017-03-22 VITALS — BP 110/80 | HR 81 | Temp 98.5°F | Ht 72.0 in | Wt 235.0 lb

## 2017-03-22 DIAGNOSIS — Z299 Encounter for prophylactic measures, unspecified: Secondary | ICD-10-CM

## 2017-03-23 ENCOUNTER — Encounter: Payer: Self-pay | Admitting: Physician Assistant

## 2017-03-23 ENCOUNTER — Ambulatory Visit: Payer: Self-pay | Admitting: Physician Assistant

## 2017-03-23 VITALS — BP 140/100 | HR 79 | Temp 98.2°F

## 2017-03-23 DIAGNOSIS — Z0189 Encounter for other specified special examinations: Secondary | ICD-10-CM

## 2017-03-23 DIAGNOSIS — Z0000 Encounter for general adult medical examination without abnormal findings: Secondary | ICD-10-CM

## 2017-03-23 DIAGNOSIS — Z008 Encounter for other general examination: Secondary | ICD-10-CM

## 2017-03-23 LAB — COMPREHENSIVE METABOLIC PANEL
ALBUMIN: 4.4 g/dL (ref 3.5–5.5)
ALT: 66 IU/L — ABNORMAL HIGH (ref 0–44)
AST: 42 IU/L — ABNORMAL HIGH (ref 0–40)
Albumin/Globulin Ratio: 1.7 (ref 1.2–2.2)
Alkaline Phosphatase: 85 IU/L (ref 39–117)
BILIRUBIN TOTAL: 0.3 mg/dL (ref 0.0–1.2)
BUN/Creatinine Ratio: 19 (ref 9–20)
BUN: 18 mg/dL (ref 6–20)
CALCIUM: 9.3 mg/dL (ref 8.7–10.2)
CO2: 21 mmol/L (ref 18–29)
CREATININE: 0.94 mg/dL (ref 0.76–1.27)
Chloride: 103 mmol/L (ref 96–106)
GFR, EST AFRICAN AMERICAN: 121 mL/min/{1.73_m2} (ref >59)
GFR, EST NON AFRICAN AMERICAN: 105 mL/min/{1.73_m2} (ref >59)
GLUCOSE: 95 mg/dL (ref 65–99)
Globulin, Total: 2.6 g/dL (ref 1.5–4.5)
Potassium: 4.9 mmol/L (ref 3.5–5.2)
Sodium: 142 mmol/L (ref 134–144)
TOTAL PROTEIN: 7 g/dL (ref 6.0–8.5)

## 2017-03-23 NOTE — Progress Notes (Signed)
S: pt here for wellness physical and biometrics for insurance purposes, no complaints ros neg. Has been seeing his pcp for abnormal liver enzymes, had several tests run and still doesn't have a diagnosis;  PMH:   As stated  Social:  Nonsmoker, does snuff, no etoh, quit drinking when his labs came back abnormal  Fam: liver problems (fam members who drank heavily), substance abuse (etoh), no drugs  O: vitals wnl, nad, ENT wnl, neck supple no lymph, lungs c t a, cv rrr, abd soft nontender bs normal all 4 quads  A: wellness, biometric physical  P: f/u with pcp as needed

## 2017-03-23 NOTE — Progress Notes (Signed)
Patient came in to have blood drawn for testing per Dr. Angus SellerMelissa V Cabellon's orders.  Patient also had his biometric screening done.  Patient will return to the clinic to get his physical.

## 2017-09-25 IMAGING — DX DG KNEE STANDING AP BILAT
1 series · 1 of 1 positions shown · non-contrast
Comparison: None.

CLINICAL DATA: Chronic bilateral knee pain.  History of gout.

EXAM:
BILATERAL KNEES STANDING - 1 VIEW

[knee ap]
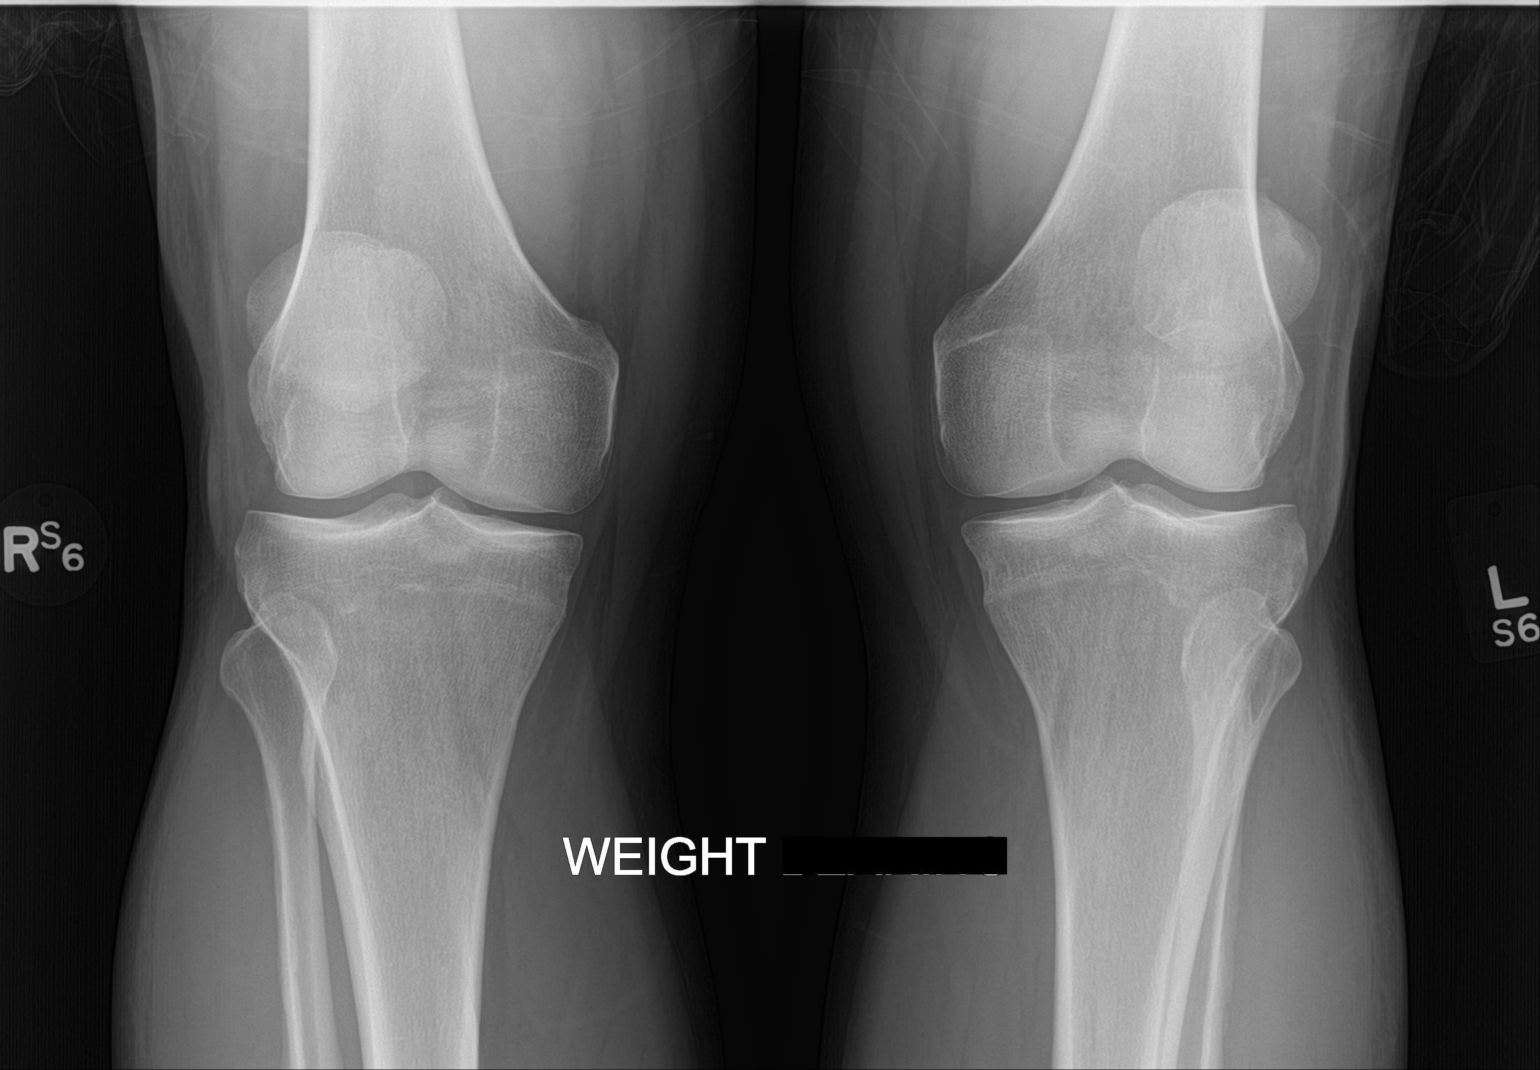

[1 of 1 positions shown; findings below may reference images not displayed]

FINDINGS: Single AP standing view of both knees. The mineralization and
alignment are normal. The joint spaces are maintained. No erosive
changes or soft tissue calcifications demonstrated.
IMPRESSION: Negative standing AP view of both knees.

## 2017-10-17 ENCOUNTER — Ambulatory Visit: Payer: Self-pay | Admitting: Physician Assistant

## 2017-10-17 VITALS — BP 119/85 | HR 95 | Temp 98.7°F | Resp 16

## 2017-10-17 DIAGNOSIS — J069 Acute upper respiratory infection, unspecified: Secondary | ICD-10-CM

## 2017-10-17 MED ORDER — AMOXICILLIN 875 MG PO TABS: 875.0000 mg | ORAL_TABLET | Freq: Two times a day (BID) | ORAL | 0 refills | Status: DC

## 2017-10-17 NOTE — Progress Notes (Signed)
S: C/o sore throat, runny nose and congestion for 6 days, + fever, chills on first day, none since, denies cp/sob, v/d; mucus was green this am but clear throughout the day, cough is sporadic,   Using otc meds: nyquil/dayquil  O: PE: vitals wnl, nad, perrl eomi, normocephalic, tms dull, nasal mucosa red and swollen, throat injected, neck supple no lymph, lungs c t a, cv rrr, neuro intact  A:  Acute  uri   P: drink fluids, continue regular meds , use otc meds of choice, return if not improving in 5 days, return earlier if worsening, amoxil

## 2017-11-14 DIAGNOSIS — Z720 Tobacco use: Secondary | ICD-10-CM | POA: Insufficient documentation

## 2017-11-14 DIAGNOSIS — M222X9 Patellofemoral disorders, unspecified knee: Secondary | ICD-10-CM | POA: Insufficient documentation

## 2017-11-14 DIAGNOSIS — R0683 Snoring: Secondary | ICD-10-CM | POA: Insufficient documentation

## 2017-12-18 IMAGING — DX DG LUMBAR SPINE COMPLETE 4+V
5 series · 5 of 5 positions shown · non-contrast
Comparison: None.

CLINICAL DATA: Chronic back pain.

EXAM:
LUMBAR SPINE - COMPLETE 4+ VIEW

[l-spine ap]
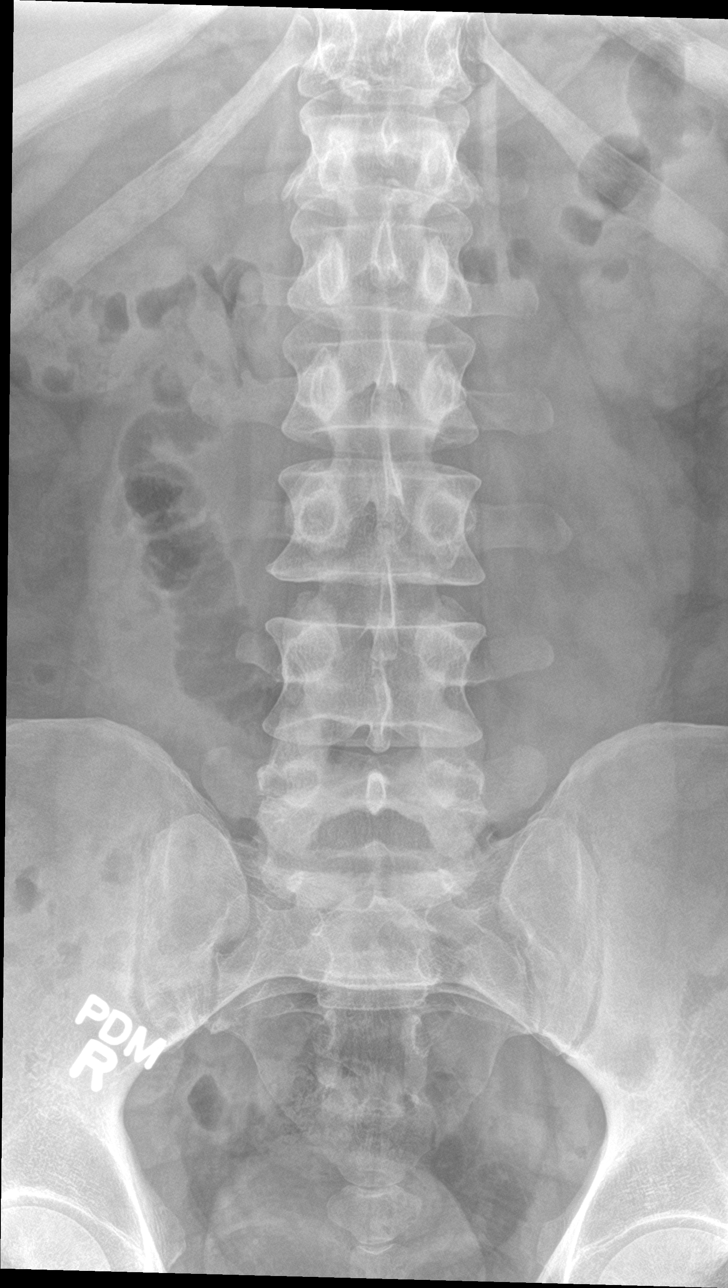

[l-spine obl (1 of 2)]
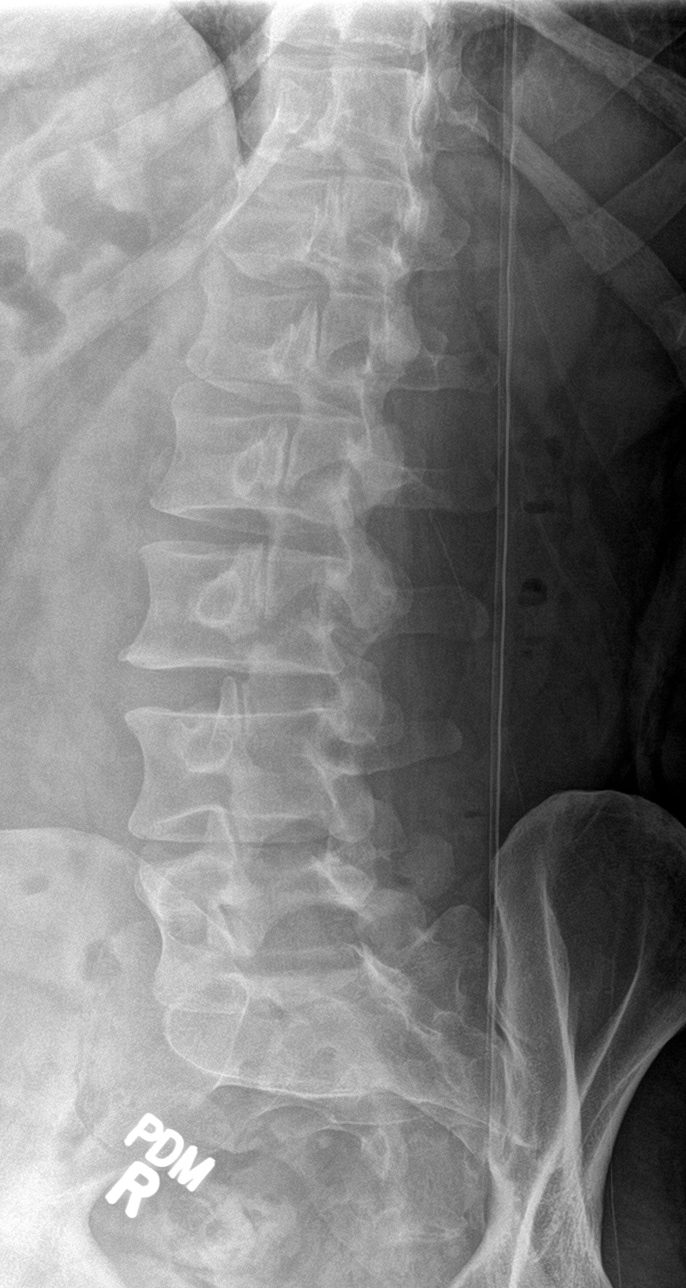

[l-spine obl (2 of 2)]
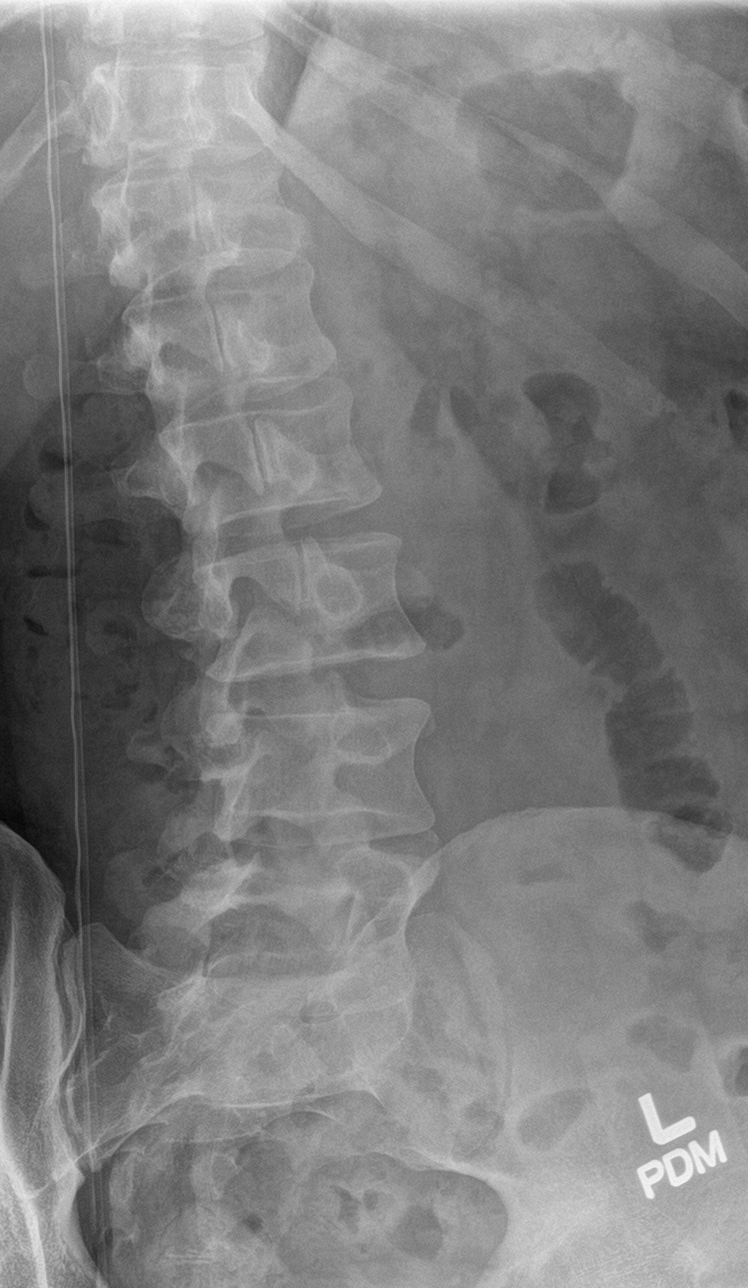

[l-spine lat]
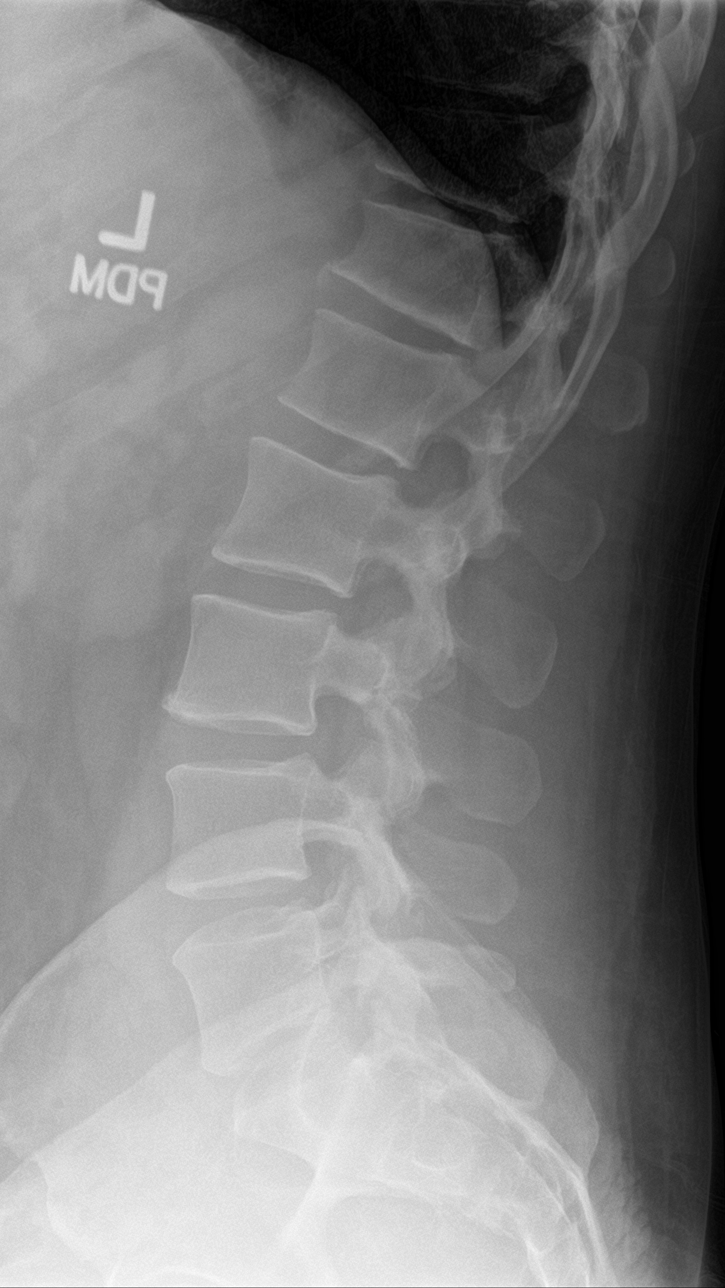

[l-spine spot]
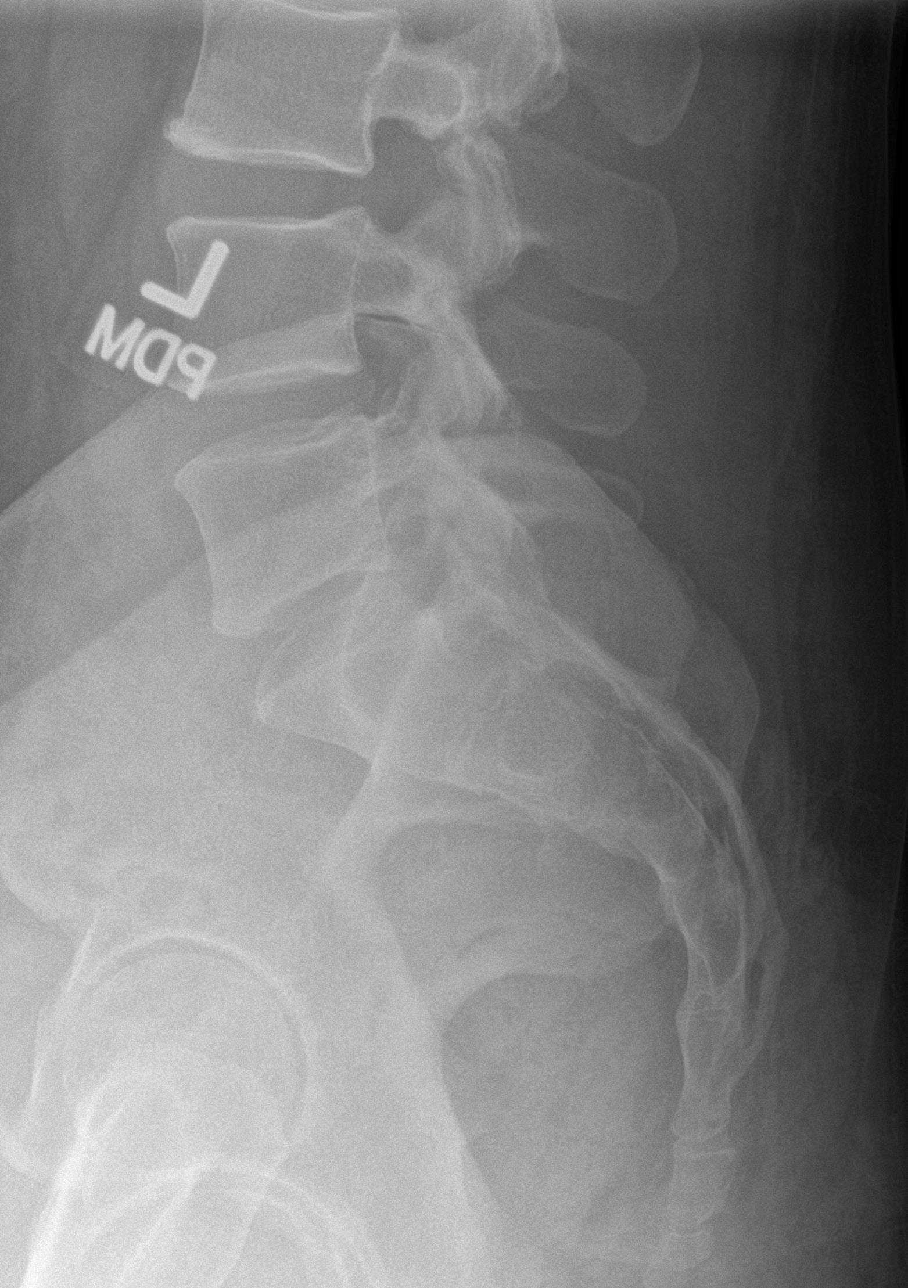

[5 of 5 positions shown; findings below may reference images not displayed]

FINDINGS: Normal overall alignment of the lumbar vertebral bodies. Very slight
anterolisthesis of L3 due to bilateral pars defects. Disc spaces and
vertebral bodies are maintained. There are mild wedge like
compression deformities of T11 and T12 of uncertain age. The
visualized bony pelvis is intact. The SI joints appear normal.
IMPRESSION: Bilateral pars defects at L3 with minimal anterolisthesis.

No acute bony findings or significant degenerative changes.

## 2018-02-14 DIAGNOSIS — E785 Hyperlipidemia, unspecified: Secondary | ICD-10-CM | POA: Insufficient documentation

## 2018-02-14 DIAGNOSIS — R7989 Other specified abnormal findings of blood chemistry: Secondary | ICD-10-CM | POA: Insufficient documentation

## 2018-04-02 ENCOUNTER — Other Ambulatory Visit: Payer: Self-pay

## 2018-04-02 DIAGNOSIS — E785 Hyperlipidemia, unspecified: Secondary | ICD-10-CM

## 2018-04-02 DIAGNOSIS — R7989 Other specified abnormal findings of blood chemistry: Secondary | ICD-10-CM

## 2018-04-02 DIAGNOSIS — R945 Abnormal results of liver function studies: Secondary | ICD-10-CM

## 2018-04-03 LAB — LIPID PANEL
CHOL/HDL RATIO: 4.5 ratio (ref 0.0–5.0)
CHOLESTEROL TOTAL: 185 mg/dL (ref 100–199)
HDL: 41 mg/dL (ref >39)
LDL CALC: 120 mg/dL — AB (ref 0–99)
TRIGLYCERIDES: 122 mg/dL (ref 0–149)
VLDL CHOLESTEROL CAL: 24 mg/dL (ref 5–40)

## 2018-04-03 LAB — COMPREHENSIVE METABOLIC PANEL
A/G RATIO: 2.3 — AB (ref 1.2–2.2)
ALK PHOS: 86 IU/L (ref 39–117)
ALT: 99 IU/L — AB (ref 0–44)
AST: 41 IU/L — AB (ref 0–40)
Albumin: 4.5 g/dL (ref 3.5–5.5)
BILIRUBIN TOTAL: 0.4 mg/dL (ref 0.0–1.2)
BUN/Creatinine Ratio: 14 (ref 9–20)
BUN: 15 mg/dL (ref 6–20)
CALCIUM: 9.7 mg/dL (ref 8.7–10.2)
CHLORIDE: 107 mmol/L — AB (ref 96–106)
CO2: 23 mmol/L (ref 20–29)
Creatinine, Ser: 1.07 mg/dL (ref 0.76–1.27)
GFR calc Af Amer: 103 mL/min/{1.73_m2} (ref >59)
GFR calc non Af Amer: 89 mL/min/{1.73_m2} (ref >59)
GLOBULIN, TOTAL: 2 g/dL (ref 1.5–4.5)
Glucose: 113 mg/dL — ABNORMAL HIGH (ref 65–99)
POTASSIUM: 5 mmol/L (ref 3.5–5.2)
SODIUM: 143 mmol/L (ref 134–144)
Total Protein: 6.5 g/dL (ref 6.0–8.5)

## 2018-04-19 ENCOUNTER — Ambulatory Visit: Payer: Self-pay | Admitting: Family Medicine

## 2018-04-19 VITALS — BP 112/72 | HR 67 | Temp 97.7°F | Resp 16 | Ht 72.0 in | Wt 215.0 lb

## 2018-04-19 DIAGNOSIS — Z008 Encounter for other general examination: Secondary | ICD-10-CM

## 2018-04-19 DIAGNOSIS — Z0189 Encounter for other specified special examinations: Principal | ICD-10-CM

## 2018-04-19 LAB — GLUCOSE, POCT (MANUAL RESULT ENTRY): POC Glucose: 104 mg/dl — AB (ref 70–99)

## 2018-04-19 NOTE — Progress Notes (Signed)
Subjective: Annual biometrics screening  Patient presents for his annual biometric screening. Patient reports a history of chronic low back pain.  [facility]  Patient reports that he has been dieting to lose weight and has cut out sweets and carbohydrates.  Patient reports being very active at work. Patient works for maintenance. Patient denies any other issues or concerns.   Review of Systems Unremarkable  Objective  Physical Exam General: Awake, alert and oriented. No acute distress. Well developed, hydrated and nourished. Appears stated age.  HEENT: Supple neck without adenopathy. Sclera is non-icteric. The ear canal is clear without discharge. The tympanic membrane is normal in appearance with normal landmarks and cone of light. Nasal mucosa is pink and moist. Oral mucosa is pink and moist. The pharynx is normal in appearance without tonsillar swelling or exudates.  Skin: Skin in warm, dry and intact without rashes or lesions. Appropriate color for ethnicity. Cardiac: Heart rate and rhythm are normal. No murmurs, gallops, or rubs are auscultated.  Respiratory: The chest wall is symmetric and without deformity. No signs of respiratory distress. Lung sounds are clear in all lobes bilaterally without rales, ronchi, or wheezes.  Neurological: The patient is awake, alert and oriented to person, place, and time with normal speech.  Memory is normal and thought processes intact. No gait abnormalities are appreciated.  Psychiatric: Appropriate mood and affect.   Assessment Annual biometrics screening  Plan  Lipid panel pending. Encouraged routine visits with primary care provider.  Fasting blood sugar is 104 today.  Discussed the implications of prediabetes and diabetes.  Patient reports that he is aware of this and is trying to lower this through healthy dieting.  Patient reports he recently saw his primary care provider who worked him  up for impaired fasting glucose.  Advised patient to follow-up with his primary care provider regarding this.

## 2018-04-20 LAB — LIPID PANEL
Chol/HDL Ratio: 4.7 ratio (ref 0.0–5.0)
Cholesterol, Total: 206 mg/dL — ABNORMAL HIGH (ref 100–199)
HDL: 44 mg/dL (ref >39)
LDL CALC: 133 mg/dL — AB (ref 0–99)
Triglycerides: 143 mg/dL (ref 0–149)
VLDL CHOLESTEROL CAL: 29 mg/dL (ref 5–40)

## 2018-04-20 NOTE — Progress Notes (Signed)
Carollee HerterShannon, Will you call the patient and inform them that their lipid panel came back?  Everything is normal with the exception of his total cholesterol and LDL cholesterol, which are elevated.  The total cholesterol is 206, normal values are between 100 and 199. The LDL cholesterol ("bad cholesterol") is 133, normal values are below 99. These abnormal values increase their risk for cardiovascular disease.  Please advise the patient to discuss this with their primary care provider at their next visit.

## 2018-08-08 ENCOUNTER — Other Ambulatory Visit: Payer: Self-pay

## 2018-08-08 DIAGNOSIS — R5383 Other fatigue: Secondary | ICD-10-CM

## 2018-08-08 DIAGNOSIS — R945 Abnormal results of liver function studies: Secondary | ICD-10-CM

## 2018-08-08 DIAGNOSIS — R7989 Other specified abnormal findings of blood chemistry: Secondary | ICD-10-CM

## 2018-08-08 DIAGNOSIS — E785 Hyperlipidemia, unspecified: Secondary | ICD-10-CM

## 2018-08-08 DIAGNOSIS — M109 Gout, unspecified: Secondary | ICD-10-CM

## 2018-08-09 LAB — SPECIMEN STATUS

## 2018-08-10 LAB — LIPID PANEL
CHOL/HDL RATIO: 4.4 ratio (ref 0.0–5.0)
Cholesterol, Total: 206 mg/dL — ABNORMAL HIGH (ref 100–199)
HDL: 47 mg/dL (ref >39)
LDL CALC: 125 mg/dL — AB (ref 0–99)
TRIGLYCERIDES: 171 mg/dL — AB (ref 0–149)
VLDL Cholesterol Cal: 34 mg/dL (ref 5–40)

## 2018-08-10 LAB — COMPREHENSIVE METABOLIC PANEL
ALBUMIN: 4.6 g/dL (ref 3.5–5.5)
ALT: 42 IU/L (ref 0–44)
AST: 24 IU/L (ref 0–40)
Albumin/Globulin Ratio: 2.1 (ref 1.2–2.2)
Alkaline Phosphatase: 88 IU/L (ref 39–117)
BUN/Creatinine Ratio: 25 — ABNORMAL HIGH (ref 9–20)
BUN: 25 mg/dL — ABNORMAL HIGH (ref 6–20)
Bilirubin Total: 0.5 mg/dL (ref 0.0–1.2)
CALCIUM: 9.4 mg/dL (ref 8.7–10.2)
CO2: 19 mmol/L — AB (ref 20–29)
CREATININE: 1.02 mg/dL (ref 0.76–1.27)
Chloride: 104 mmol/L (ref 96–106)
GFR calc Af Amer: 108 mL/min/{1.73_m2} (ref >59)
GFR, EST NON AFRICAN AMERICAN: 93 mL/min/{1.73_m2} (ref >59)
GLOBULIN, TOTAL: 2.2 g/dL (ref 1.5–4.5)
Glucose: 93 mg/dL (ref 65–99)
Potassium: 4.6 mmol/L (ref 3.5–5.2)
SODIUM: 140 mmol/L (ref 134–144)
Total Protein: 6.8 g/dL (ref 6.0–8.5)

## 2018-08-10 LAB — VITAMIN D 25 HYDROXY (VIT D DEFICIENCY, FRACTURES): Vit D, 25-Hydroxy: 31.7 ng/mL (ref 30.0–100.0)

## 2018-08-10 LAB — URIC ACID: Uric Acid: 7.4 mg/dL (ref 3.7–8.6)

## 2019-06-17 ENCOUNTER — Encounter: Payer: Self-pay | Admitting: Adult Health

## 2019-06-17 ENCOUNTER — Ambulatory Visit: Payer: Managed Care, Other (non HMO) | Admitting: Adult Health

## 2019-06-17 ENCOUNTER — Other Ambulatory Visit: Payer: Self-pay

## 2019-06-17 VITALS — BP 116/82 | HR 82 | Temp 97.7°F | Resp 16 | Ht 72.0 in | Wt 216.0 lb

## 2019-06-17 DIAGNOSIS — Z008 Encounter for other general examination: Secondary | ICD-10-CM

## 2019-06-17 DIAGNOSIS — Z0189 Encounter for other specified special examinations: Secondary | ICD-10-CM | POA: Diagnosis not present

## 2019-06-17 NOTE — Progress Notes (Signed)
Vincent Mcguire DOB: 38 y.o. 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 He denies any health concerns at this time. He is active and tries to exercise.  He tries to eat healthy. He denies any recent illness.   Patient  denies any fever, body aches,chills, rash, chest pain, shortness of breath, nausea, vomiting, or diarrhea.   Objective: Blood pressure 116/82, pulse 82, temperature 97.7 F (36.5 C), temperature source Oral, resp. rate 16, height 6' (1.829 m), weight 216 lb (98 kg), SpO2 97 %. NAD HEENT: Within normal limits Neck: Normal, neck supple  Heart: Regular rate and rhythm Lungs: Clear  Assessment: Biometric screen   Plan: He has a primary care provider he sees regularly.  He has lab orders that he left at home from his primary care provider and he will call the clinic and schedule an appointment for lab draw once he locates or get his PCP to order again.   Fasting glucose and lipids. Discussed with patient that today's visit here is a limited biometric screening visit (not a comprehensive exam or management of any chronic problems) Discussed some health issues, including healthy eating habits and exercise. Encouraged to follow-up with PCP for annual comprehensive preventive and wellness care (and if applicable, any chronic issues). Questions invited and answered.   I will have the office call you on your glucose and cholesterol results when they return if you have not heard within 1 week please call the office.  This biometric physical is a brief physical and the only labs done are glucose and your lipid panel(cholesterol) and is  not a substitute for seeing a primary care provider for a complete annual physical. Please see a primary care physician for routine health maintenance, labs and full physical at least yearly and follow up as  recommended by your provider. Provider also recommends if you do not have a primary care provider for patient to establish care as soon as possible .Patient may chose provider of choice. Also gave the [facility] at [phone] or web site at [email or web address] to help assist with finding a primary care doctor.  Patient verbalizes understanding that his office is acute care only and not a substitute for a primary care or for the management of chronic conditions.    Follow up with primary care as needed for chronic and maintenance health care- can be seen in this employee clinic for acute care.

## 2019-06-17 NOTE — Patient Instructions (Signed)
I will have the office call you on your glucose and cholesterol results when they return if you have not heard within 1 week please call the office.  This biometric physical is a brief physical and the only labs done are glucose and your lipid panel(cholesterol) and is  not a substitute for seeing a primary care provider for a complete annual physical. Please see a primary care physician for routine health maintenance, labs and full physical at least yearly and follow up as recommended by your provider. Provider also recommends if you do not have a primary care provider for patient to establish care as soon as possible .Patient may chose provider of choice. Also gave the [facility] at [phone] or web site at [email or web address] to help assist with finding a primary care doctor.  Patient verbalizes understanding that his office is acute care only and not a substitute for a primary care or for the management of chronic conditions.    Follow up with primary care as needed for chronic and maintenance health care- can be seen in this employee clinic for acute care.    Health Maintenance, Male A healthy lifestyle and preventive care is important for your health and wellness. Ask your health care provider about what schedule of regular examinations is right for you. What should I know about weight and diet? Eat a Healthy Diet  Eat plenty of vegetables, fruits, whole grains, low-fat dairy products, and lean protein.  Do not eat a lot of foods high in solid fats, added sugars, or salt.  Maintain a Healthy Weight Regular exercise can help you achieve or maintain a healthy weight. You should:  Do at least 150 minutes of exercise each week. The exercise should increase your heart rate and make you sweat (moderate-intensity exercise).  Do strength-training exercises at least twice a week. Watch Your Levels of Cholesterol and Blood Lipids  Have your blood tested  for lipids and cholesterol every 5 years starting at 38 years of age. If you are at high risk for heart disease, you should start having your blood tested when you are 38 years old. You may need to have your cholesterol levels checked more often if: ? Your lipid or cholesterol levels are high. ? You are older than 38 years of age. ? You are at high risk for heart disease. What should I know about cancer screening? Many types of cancers can be detected early and may often be prevented. Lung Cancer  You should be screened every year for lung cancer if: ? You are a current smoker who has smoked for at least 30 years. ? You are a former smoker who has quit within the past 15 years.  Talk to your health care provider about your screening options, when you should start screening, and how often you should be screened. Colorectal Cancer  Routine colorectal cancer screening usually begins at 38 years of age and should be repeated every 5-10 years until you are 38 years old. You may need to be screened more often if early forms of precancerous polyps or small growths are found. Your health care provider may recommend screening at an earlier age if you have risk factors for colon cancer.  Your health care provider may recommend using home test kits to check for hidden blood in the stool.  A small camera at the end of a tube can be used to examine your colon (sigmoidoscopy or colonoscopy). This checks for  the earliest forms of colorectal cancer. Prostate and Testicular Cancer  Depending on your age and overall health, your health care provider may do certain tests to screen for prostate and testicular cancer.  Talk to your health care provider about any symptoms or concerns you have about testicular or prostate cancer. Skin Cancer  Check your skin from head to toe regularly.  Tell your health care provider about any new moles or changes in moles, especially if: ? There is a change in a mole's size,  shape, or color. ? You have a mole that is larger than a pencil eraser.  Always use sunscreen. Apply sunscreen liberally and repeat throughout the day.  Protect yourself by wearing long sleeves, pants, a wide-brimmed hat, and sunglasses when outside. What should I know about heart disease, diabetes, and high blood pressure?  If you are 17-57 years of age, have your blood pressure checked every 3-5 years. If you are 25 years of age or older, have your blood pressure checked every year. You should have your blood pressure measured twice-once when you are at a hospital or clinic, and once when you are not at a hospital or clinic. Record the average of the two measurements. To check your blood pressure when you are not at a hospital or clinic, you can use: ? An automated blood pressure machine at a pharmacy. ? A home blood pressure monitor.  Talk to your health care provider about your target blood pressure.  If you are between 55-35 years old, ask your health care provider if you should take aspirin to prevent heart disease.  Have regular diabetes screenings by checking your fasting blood sugar level. ? If you are at a normal weight and have a low risk for diabetes, have this test once every three years after the age of 90. ? If you are overweight and have a high risk for diabetes, consider being tested at a younger age or more often.  A one-time screening for abdominal aortic aneurysm (AAA) by ultrasound is recommended for men aged 67-75 years who are current or former smokers. What should I know about preventing infection? Hepatitis B If you have a higher risk for hepatitis B, you should be screened for this virus. Talk with your health care provider to find out if you are at risk for hepatitis B infection. Hepatitis C Blood testing is recommended for:  Everyone born from [birth year] through 1965.  Anyone with known risk factors for hepatitis C. Sexually Transmitted Diseases (STDs)  You  should be screened each year for STDs including gonorrhea and chlamydia if: ? You are sexually active and are younger than 38 years of age. ? You are older than 38 years of age and your health care provider tells you that you are at risk for this type of infection. ? Your sexual activity has changed since you were last screened and you are at an increased risk for chlamydia or gonorrhea. Ask your health care provider if you are at risk.  Talk with your health care provider about whether you are at high risk of being infected with HIV. Your health care provider may recommend a prescription medicine to help prevent HIV infection. What else can I do?  Schedule regular health, dental, and eye exams.  Stay current with your vaccines (immunizations).  Do not use any tobacco products, such as cigarettes, chewing tobacco, and e-cigarettes. If you need help quitting, ask your health care provider.  Limit alcohol intake to no more  than 2 drinks per day. One drink equals 12 ounces of beer, 5 ounces of wine, or 1 ounces of hard liquor.  Do not use street drugs.  Do not share needles.  Ask your health care provider for help if you need support or information about quitting drugs.  Tell your health care provider if you often feel depressed.  Tell your health care provider if you have ever been abused or do not feel safe at home. This information is not intended to replace advice given to you by your health care provider. Make sure you discuss any questions you have with your health care provider. Document Released: 06/09/2008 Document Revised: 08/10/2016 Document Reviewed: 09/15/2015 Elsevier Interactive Patient Education  2019 [employer].

## 2019-06-18 LAB — LIPID PANEL WITH LDL/HDL RATIO
Cholesterol, Total: 240 mg/dL — ABNORMAL HIGH (ref 100–199)
HDL: 48 mg/dL (ref >39)
LDL Calculated: 163 mg/dL — ABNORMAL HIGH (ref 0–99)
LDl/HDL Ratio: 3.4 ratio (ref 0.0–3.6)
Triglycerides: 145 mg/dL (ref 0–149)
VLDL Cholesterol Cal: 29 mg/dL (ref 5–40)

## 2019-06-18 LAB — GLUCOSE, RANDOM: Glucose: 117 mg/dL — ABNORMAL HIGH (ref 65–99)

## 2019-06-18 NOTE — Progress Notes (Signed)
Bretlyn, Would you please call the patient and let them know that their cholesterol is elevated at 240, normal range is  100-199.  Triglycerides are within normal range.  LDL which is patient's bad cholesterol is 163, this should be less than 99.  HDL is good at 48 we like this number to be over 39, this is good cholesterol.  Advised the patient follow-up with her primary care within 1 to 2 months, make aggressive lifestyle changes with diet and daily exercise at least 30 minutes. Thanks, Laverna Peace MSN, AGNP-C, FNP-C
